# Patient Record
Sex: Female | Born: 1975 | Race: White | Hispanic: No | Marital: Married | Smoking: Never smoker
Health system: Southern US, Community
[De-identification: ages and names within clinical notes are randomized; demographics above are authoritative.]

---

## 1999-01-27 ENCOUNTER — Other Ambulatory Visit: Admission: RE | Admit: 1999-01-27 | Discharge: 1999-01-27 | Payer: Self-pay | Admitting: Family Medicine

## 1999-11-04 ENCOUNTER — Other Ambulatory Visit: Admission: RE | Admit: 1999-11-04 | Discharge: 1999-11-04 | Payer: Self-pay | Admitting: Obstetrics and Gynecology

## 2000-04-21 ENCOUNTER — Inpatient Hospital Stay (HOSPITAL_COMMUNITY): Admission: AD | Admit: 2000-04-21 | Discharge: 2000-04-23 | Payer: Self-pay | Admitting: Obstetrics and Gynecology

## 2000-04-23 ENCOUNTER — Encounter: Payer: Self-pay | Admitting: Obstetrics and Gynecology

## 2000-04-26 ENCOUNTER — Ambulatory Visit (HOSPITAL_COMMUNITY): Admission: RE | Admit: 2000-04-26 | Discharge: 2000-04-26 | Payer: Self-pay | Admitting: Obstetrics and Gynecology

## 2000-04-26 ENCOUNTER — Encounter: Payer: Self-pay | Admitting: Obstetrics and Gynecology

## 2000-05-10 ENCOUNTER — Inpatient Hospital Stay (HOSPITAL_COMMUNITY): Admission: AD | Admit: 2000-05-10 | Discharge: 2000-05-13 | Payer: Self-pay | Admitting: Obstetrics and Gynecology

## 2000-06-17 ENCOUNTER — Other Ambulatory Visit: Admission: RE | Admit: 2000-06-17 | Discharge: 2000-06-17 | Payer: Self-pay | Admitting: Obstetrics and Gynecology

## 2012-02-01 ENCOUNTER — Other Ambulatory Visit: Payer: Self-pay | Admitting: Family Medicine

## 2012-02-01 DIAGNOSIS — N63 Unspecified lump in unspecified breast: Secondary | ICD-10-CM

## 2012-02-04 ENCOUNTER — Ambulatory Visit
Admission: RE | Admit: 2012-02-04 | Discharge: 2012-02-04 | Disposition: A | Discharge status: Home / Self Care | Payer: Medicaid Other | Source: Ambulatory Visit | Attending: Family Medicine | Admitting: Family Medicine

## 2012-02-04 DIAGNOSIS — N63 Unspecified lump in unspecified breast: Secondary | ICD-10-CM

## 2018-11-13 ENCOUNTER — Other Ambulatory Visit: Payer: Self-pay | Admitting: Family Medicine

## 2018-11-13 ENCOUNTER — Ambulatory Visit
Admission: RE | Admit: 2018-11-13 | Discharge: 2018-11-13 | Disposition: A | Discharge status: Home / Self Care | Payer: PRIVATE HEALTH INSURANCE | Source: Ambulatory Visit | Attending: Family Medicine | Admitting: Family Medicine

## 2018-11-13 DIAGNOSIS — M25571 Pain in right ankle and joints of right foot: Secondary | ICD-10-CM

## 2018-11-13 DIAGNOSIS — M79671 Pain in right foot: Secondary | ICD-10-CM

## 2020-05-12 ENCOUNTER — Emergency Department (HOSPITAL_COMMUNITY)
Admission: EM | Admit: 2020-05-12 | Discharge: 2020-05-12 | Disposition: A | Discharge status: Home / Self Care | Payer: PRIVATE HEALTH INSURANCE | Attending: Emergency Medicine | Admitting: Emergency Medicine

## 2020-05-12 ENCOUNTER — Encounter (HOSPITAL_COMMUNITY): Payer: Self-pay

## 2020-05-12 ENCOUNTER — Other Ambulatory Visit: Payer: Self-pay

## 2020-05-12 DIAGNOSIS — M79606 Pain in leg, unspecified: Secondary | ICD-10-CM | POA: Insufficient documentation

## 2020-05-12 DIAGNOSIS — M545 Low back pain: Secondary | ICD-10-CM | POA: Diagnosis not present

## 2020-05-12 DIAGNOSIS — Z5321 Procedure and treatment not carried out due to patient leaving prior to being seen by health care provider: Secondary | ICD-10-CM | POA: Insufficient documentation

## 2020-05-12 NOTE — ED Notes (Signed)
Pt eloped from waiting area. Called [facility]X.  

## 2020-05-12 NOTE — ED Triage Notes (Signed)
Patient reports that she rode from [facility] yesterday and today she picked up her 10 lb [place] and began having bilateral lower back pain R>L.  Patient denies any radiating of her pain to the lower extremities.

## 2020-07-07 IMAGING — CR DG FOOT COMPLETE 3+V*R*
3 series · 3 of 3 positions shown · non-contrast
Comparison: None.

CLINICAL DATA: Achilles and left posterior heel pain 3 days. No
injury.

EXAM:
RIGHT FOOT COMPLETE - 3+ VIEW

[x foot ap right]
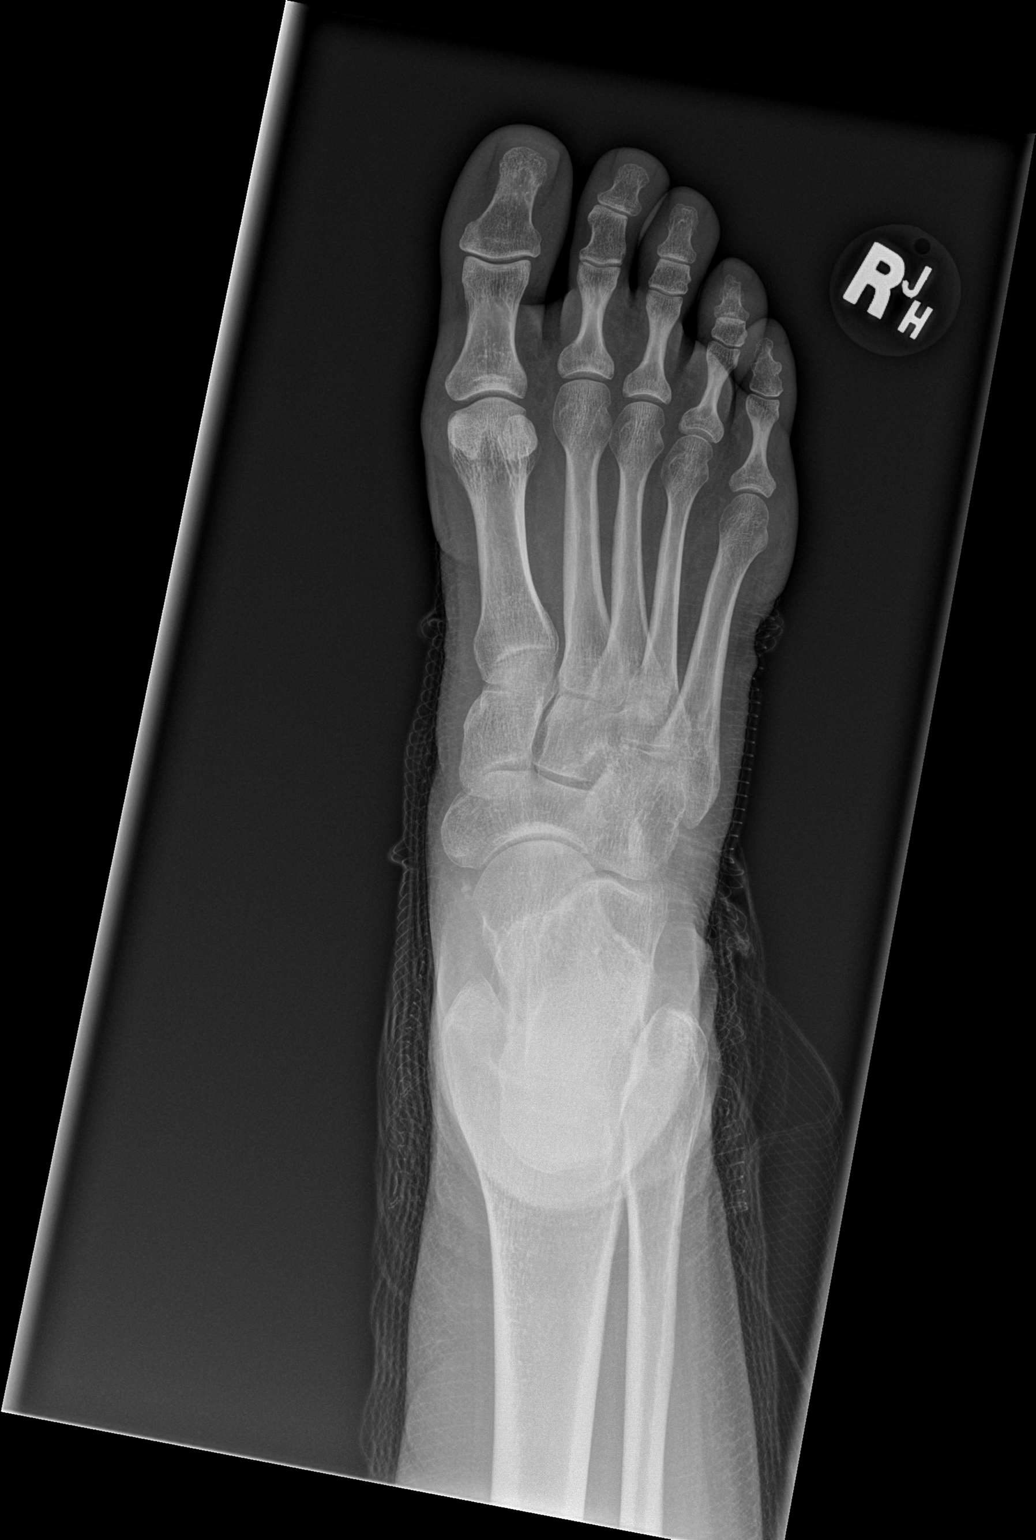

[x foot obl right]
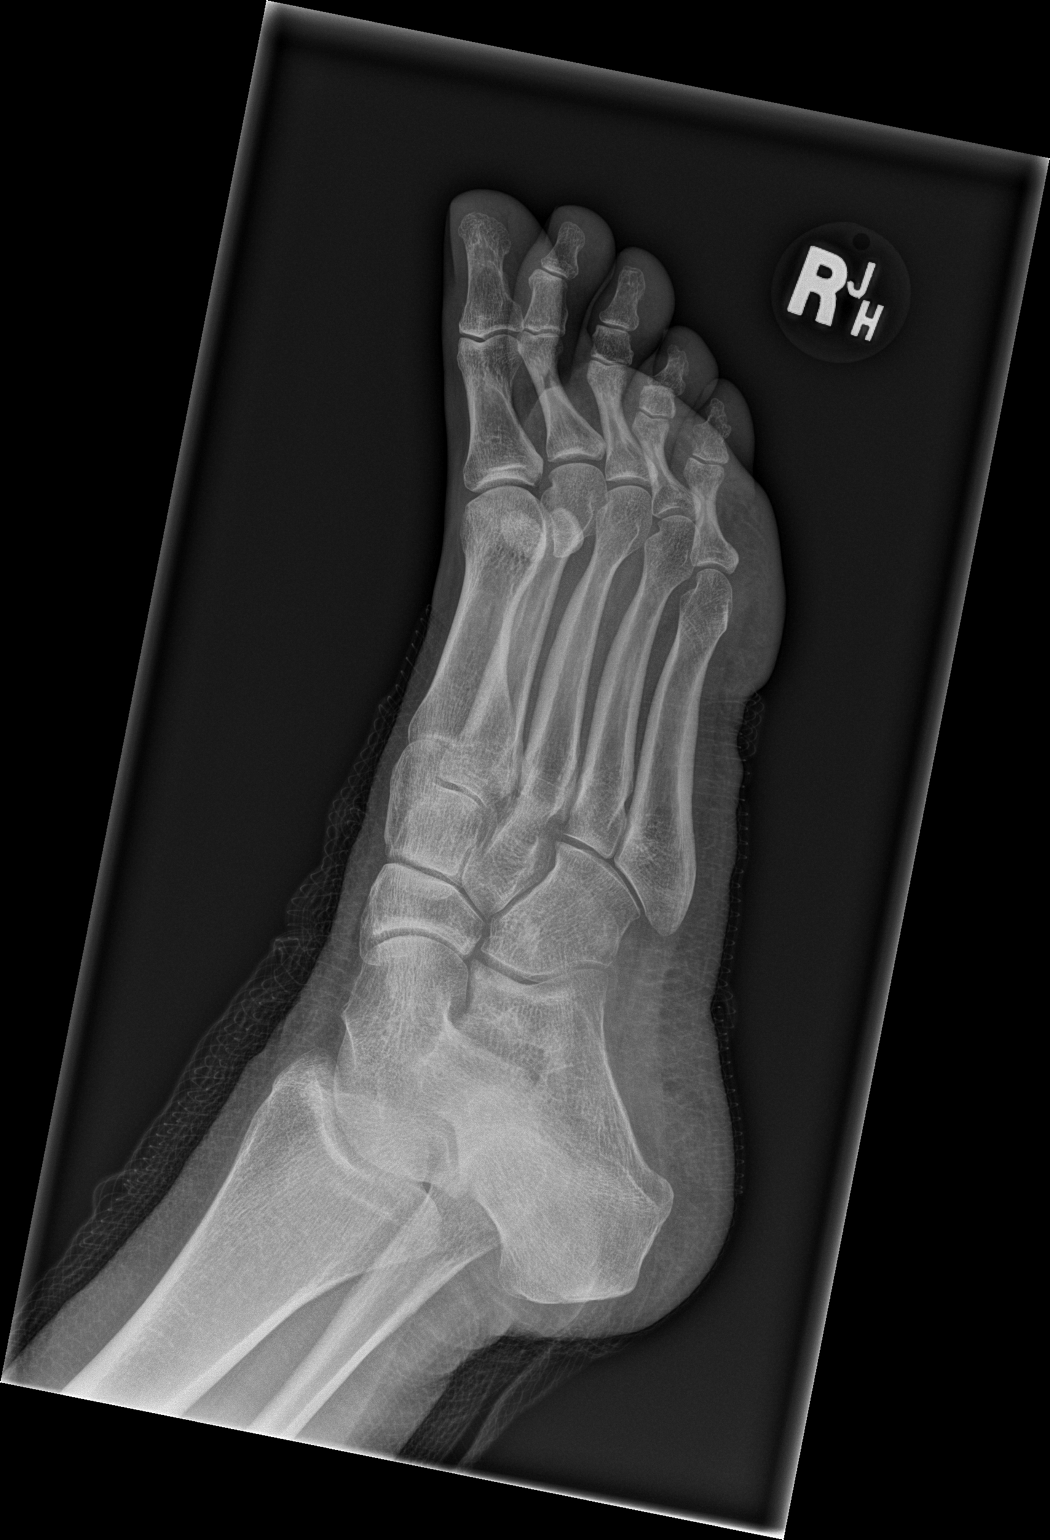

[x foot lat right]
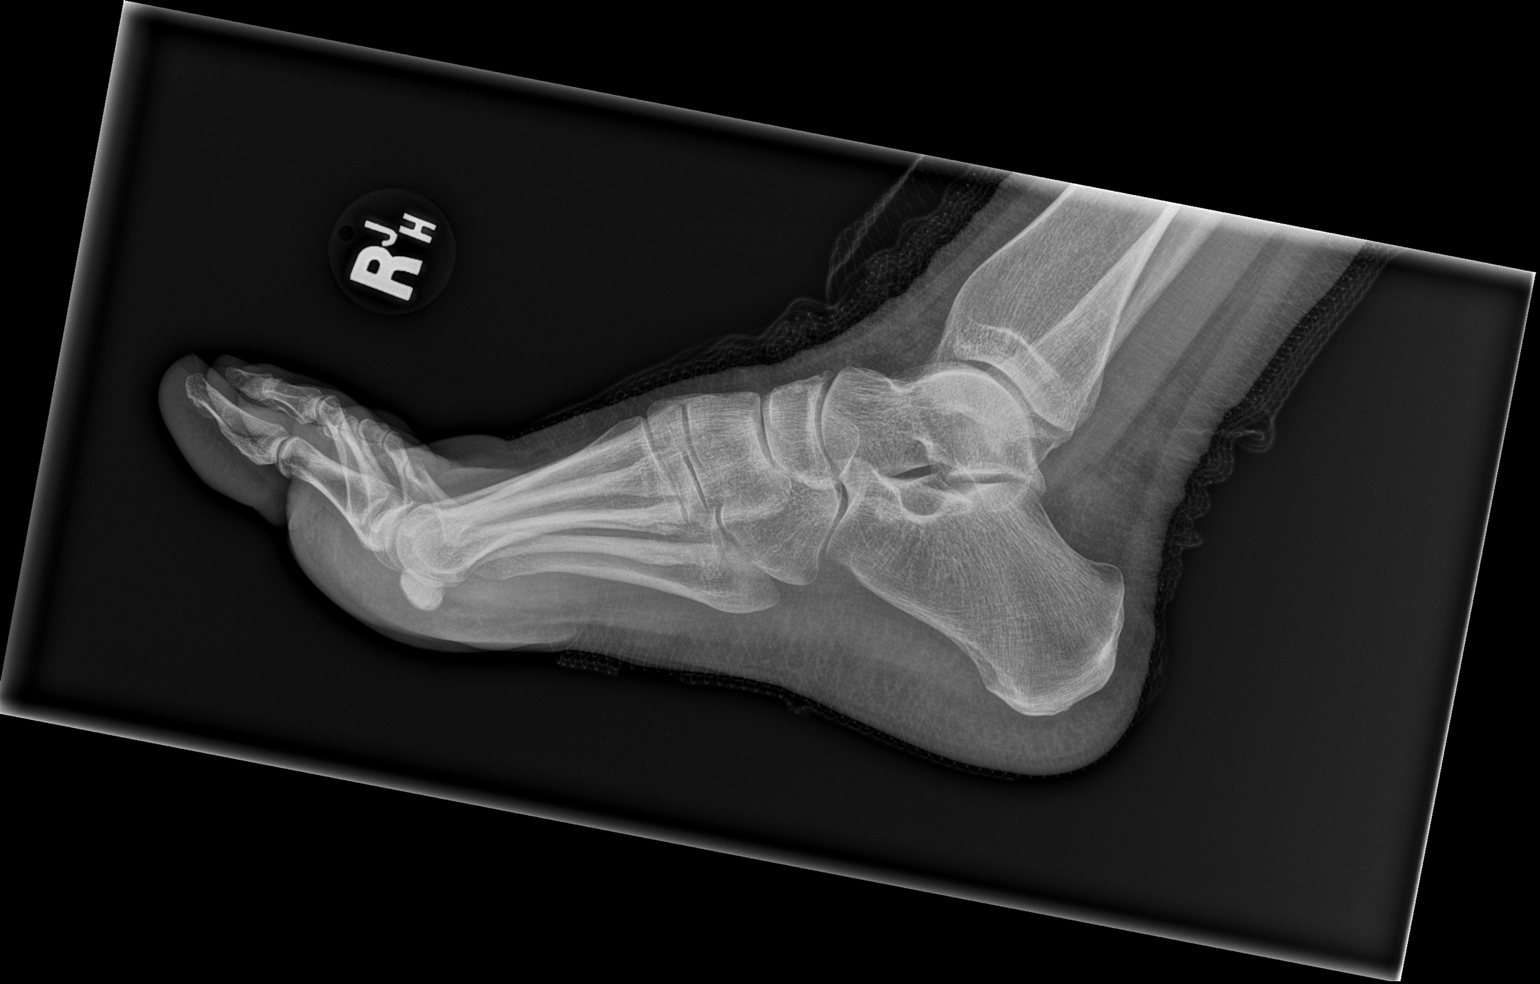

[3 of 3 positions shown; findings below may reference images not displayed]

FINDINGS: There is an overlying bandage present. No evidence of acute fracture
or dislocation.
IMPRESSION: Negative.
